# Patient Record
Sex: Male | Born: 1975 | Race: White | Hispanic: No | Marital: Single | Smoking: Current every day smoker
Health system: Southern US, Community
[De-identification: ages and names within clinical notes are randomized; demographics above are authoritative.]

## PROBLEM LIST (undated history)

## (undated) DIAGNOSIS — G8220 Paraplegia, unspecified: Secondary | ICD-10-CM

## (undated) DIAGNOSIS — E119 Type 2 diabetes mellitus without complications: Secondary | ICD-10-CM

---

## 2015-05-05 ENCOUNTER — Emergency Department (HOSPITAL_COMMUNITY)
Admission: EM | Admit: 2015-05-05 | Discharge: 2015-05-05 | Disposition: A | Discharge status: Home / Self Care | Payer: Self-pay | Attending: Emergency Medicine | Admitting: Emergency Medicine

## 2015-05-05 ENCOUNTER — Emergency Department (HOSPITAL_COMMUNITY): Payer: Self-pay

## 2015-05-05 ENCOUNTER — Encounter (HOSPITAL_COMMUNITY): Payer: Self-pay | Admitting: *Deleted

## 2015-05-05 DIAGNOSIS — W25XXXD Contact with sharp glass, subsequent encounter: Secondary | ICD-10-CM | POA: Insufficient documentation

## 2015-05-05 DIAGNOSIS — E119 Type 2 diabetes mellitus without complications: Secondary | ICD-10-CM | POA: Insufficient documentation

## 2015-05-05 DIAGNOSIS — G8220 Paraplegia, unspecified: Secondary | ICD-10-CM | POA: Insufficient documentation

## 2015-05-05 DIAGNOSIS — Z880 Allergy status to penicillin: Secondary | ICD-10-CM | POA: Insufficient documentation

## 2015-05-05 DIAGNOSIS — L03116 Cellulitis of left lower limb: Secondary | ICD-10-CM | POA: Insufficient documentation

## 2015-05-05 DIAGNOSIS — Z720 Tobacco use: Secondary | ICD-10-CM | POA: Insufficient documentation

## 2015-05-05 DIAGNOSIS — Z993 Dependence on wheelchair: Secondary | ICD-10-CM | POA: Insufficient documentation

## 2015-05-05 HISTORY — DX: Type 2 diabetes mellitus without complications: E11.9

## 2015-05-05 HISTORY — DX: Paraplegia, unspecified: G82.20

## 2015-05-05 LAB — CBG MONITORING, ED: Glucose-Capillary: 173 mg/dL — ABNORMAL HIGH (ref 65–99)

## 2015-05-05 MED ORDER — HYDROCODONE-ACETAMINOPHEN 5-325 MG PO TABS: 1.0000 | ORAL_TABLET | Freq: Four times a day (QID) | ORAL | Status: AC | PRN

## 2015-05-05 MED ORDER — CLINDAMYCIN PHOSPHATE 600 MG/50ML IV SOLN: 600.0000 mg | Freq: Once | INTRAVENOUS | Status: DC

## 2015-05-05 MED ORDER — CLINDAMYCIN HCL 300 MG PO CAPS: 300.0000 mg | ORAL_CAPSULE | Freq: Four times a day (QID) | ORAL | Status: AC

## 2015-05-05 NOTE — ED Notes (Signed)
Pt refused iv/ lab draw dr Judd Lien informed

## 2015-05-05 NOTE — ED Notes (Signed)
Pt in with wound to the bottom of his left foot, states he hit it on his power chair a few days ago, redness and swelling noted, denies fever at home

## 2015-05-05 NOTE — ED Provider Notes (Signed)
CSN: [id number]     Arrival date & time 05/05/15  1350 History   First MD Initiated Contact with Patient 05/05/15 1455     Chief Complaint  Patient presents with  . 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 He presents today for evaluation of a left foot injury. He states that he was riding in his power wheelchair when he struck his foot on a sliding glass door. Initially had a small laceration, however does now appearing more swollen and draining. He denies any fevers or chills.  Patient is a 39 y.o. male presenting with foot injury. The history is provided by the patient.  Foot Injury Location:  Foot Time since incident:  3 days Injury: yes   Foot location:  L foot Pain details:    Radiates to:  Does not radiate   Severity:  Moderate   Onset quality:  Sudden   Timing:  Constant   Progression:  Worsening   Past Medical History  Diagnosis Date  . Diabetes mellitus without complication   . Paralysis of both lower limbs    History reviewed. No pertinent past surgical history. History reviewed. No pertinent family history. History  Substance Use Topics  . Smoking status: Current Every Day Smoker  . Smokeless tobacco: Not on file  . Alcohol Use: Not on file    Review of Systems  All other systems reviewed and are negative.     Allergies  Penicillins  Home Medications   Prior to Admission medications   Not on File   BP 150/63 mmHg  Pulse 87  Temp(Src) 97.7 F (36.5 C) (Oral)  Resp 19  Ht 5\' 5"  (1.651 m)  Wt 330 lb (149.687 kg)  BMI 54.91 kg/m2  SpO2 98% Physical Exam  Constitutional: He is oriented to person, place, and time. He appears well-developed and well-nourished. No distress.  HENT:  Head: Normocephalic and atraumatic.  Neck: Normal range of motion. Neck  supple.  Musculoskeletal: Normal range of motion.  The bottom of the left foot is noted to have a laceration between the third and fourth toes that is draining a yellow, serous fluid. There is slight erythema surrounding it, however no streaks that extend up into the foot.  Neurological: He is alert and oriented to person, place, and time.  Skin: Skin is warm and dry. He is not diaphoretic.  Nursing note and vitals reviewed.   ED Course  Procedures (including critical care time) Labs Review Labs Reviewed  CBG MONITORING, ED - Abnormal; Notable for the following:    Glucose-Capillary 173 (*)    All other components within normal limits  CBC  BASIC METABOLIC PANEL    Imaging Review Dg Foot Complete Left  05/05/2015   CLINICAL DATA:  Wound at the bottom of the foot at the base the toes, struck foot on a power chair few days ago, redness and swelling noted, history diabetes mellitus, smoking  EXAM: LEFT FOOT - COMPLETE 3+ VIEW  COMPARISON:  None  FINDINGS: Diffuse soft tissue swelling.  Diffuse osseous demineralization.  Joint spaces preserved.  No acute fracture, dislocation, or bone destruction.  Accessory ossicle at medial margin of tarsal navicular, os tibiale externum.  No definite soft tissue gas or radiopaque foreign body identified.  IMPRESSION: Osseous demineralization soft tissue swelling.  No acute osseous findings.   Electronically Signed   By:  Ulyses Southward M.D.   On: 05/05/2015 14:39     EKG Interpretation None      MDM   Final diagnoses:  None    Patient presents after striking his foot on a sliding glass door.  This occurred several days ago and the wound is now appearing to be infected. He has a history of diabetes and lower extremity weakness secondary to a spinal abscess. My plan was to administer IV and oral antibiotics, however the patient is declining an IV and refusing this. He will be discharged with clindamycin and when necessary return.    Geoffery Lyons,  MD 05/05/15 [phone]

## 2015-05-05 NOTE — ED Notes (Signed)
Pt aware to return for worsening of pain drainage or fever. Pt alert x4 respirations easy non labored.

## 2015-05-05 NOTE — Discharge Instructions (Signed)
Clindamycin as prescribed.  Hydrocodone as prescribed as needed for pain.  Return to the emergency department of symptoms significantly worsen or change.   Cellulitis Cellulitis is an infection of the skin and the tissue beneath it. The infected area is usually red and tender. Cellulitis occurs most often in the arms and lower legs.  CAUSES  Cellulitis is caused by bacteria that enter the skin through cracks or cuts in the skin. The most common types of bacteria that cause cellulitis are staphylococci and streptococci. SIGNS AND SYMPTOMS   Redness and warmth.  Swelling.  Tenderness or pain.  Fever. DIAGNOSIS  Your health care provider can usually determine what is wrong based on a physical exam. Blood tests may also be done. TREATMENT  Treatment usually involves taking an antibiotic medicine. HOME CARE INSTRUCTIONS   Take your antibiotic medicine as directed by your health care provider. Finish the antibiotic even if you start to feel better.  Keep the infected arm or leg elevated to reduce swelling.  Apply a warm cloth to the affected area up to 4 times per day to relieve pain.  Take medicines only as directed by your health care provider.  Keep all follow-up visits as directed by your health care provider. SEEK MEDICAL CARE IF:   You notice red streaks coming from the infected area.  Your red area gets larger or turns dark in color.  Your bone or joint underneath the infected area becomes painful after the skin has healed.  Your infection returns in the same area or another area.  You notice a swollen bump in the infected area.  You develop new symptoms.  You have a fever. SEEK IMMEDIATE MEDICAL CARE IF:   You feel very sleepy.  You develop vomiting or diarrhea.  You have a general ill feeling (malaise) with muscle aches and pains. MAKE SURE YOU:   Understand these instructions.  Will watch your condition.  Will get help right away if you are not doing  well or get worse. Document Released: 06/30/2005 Document Revised: 02/04/2014 Document Reviewed: 12/06/2011 [facility] Patient Information 2015 [place], [place]. This information is not intended to replace advice given to you by your health care provider. Make sure you discuss any questions you have with your health care provider.

## 2015-05-05 NOTE — ED Notes (Addendum)
Pt is refusing to get in the bed and in gown for monitoring. MD notified

## 2016-08-25 IMAGING — DX DG FOOT COMPLETE 3+V*L*
3 series · 3 of 3 positions shown · non-contrast
Comparison: None

CLINICAL DATA: Wound at the bottom of the foot at the base the
toes, struck foot on a power chair few days ago, redness and
swelling noted, history diabetes mellitus, smoking

EXAM:
LEFT FOOT - COMPLETE 3+ VIEW

[x foot ap left]
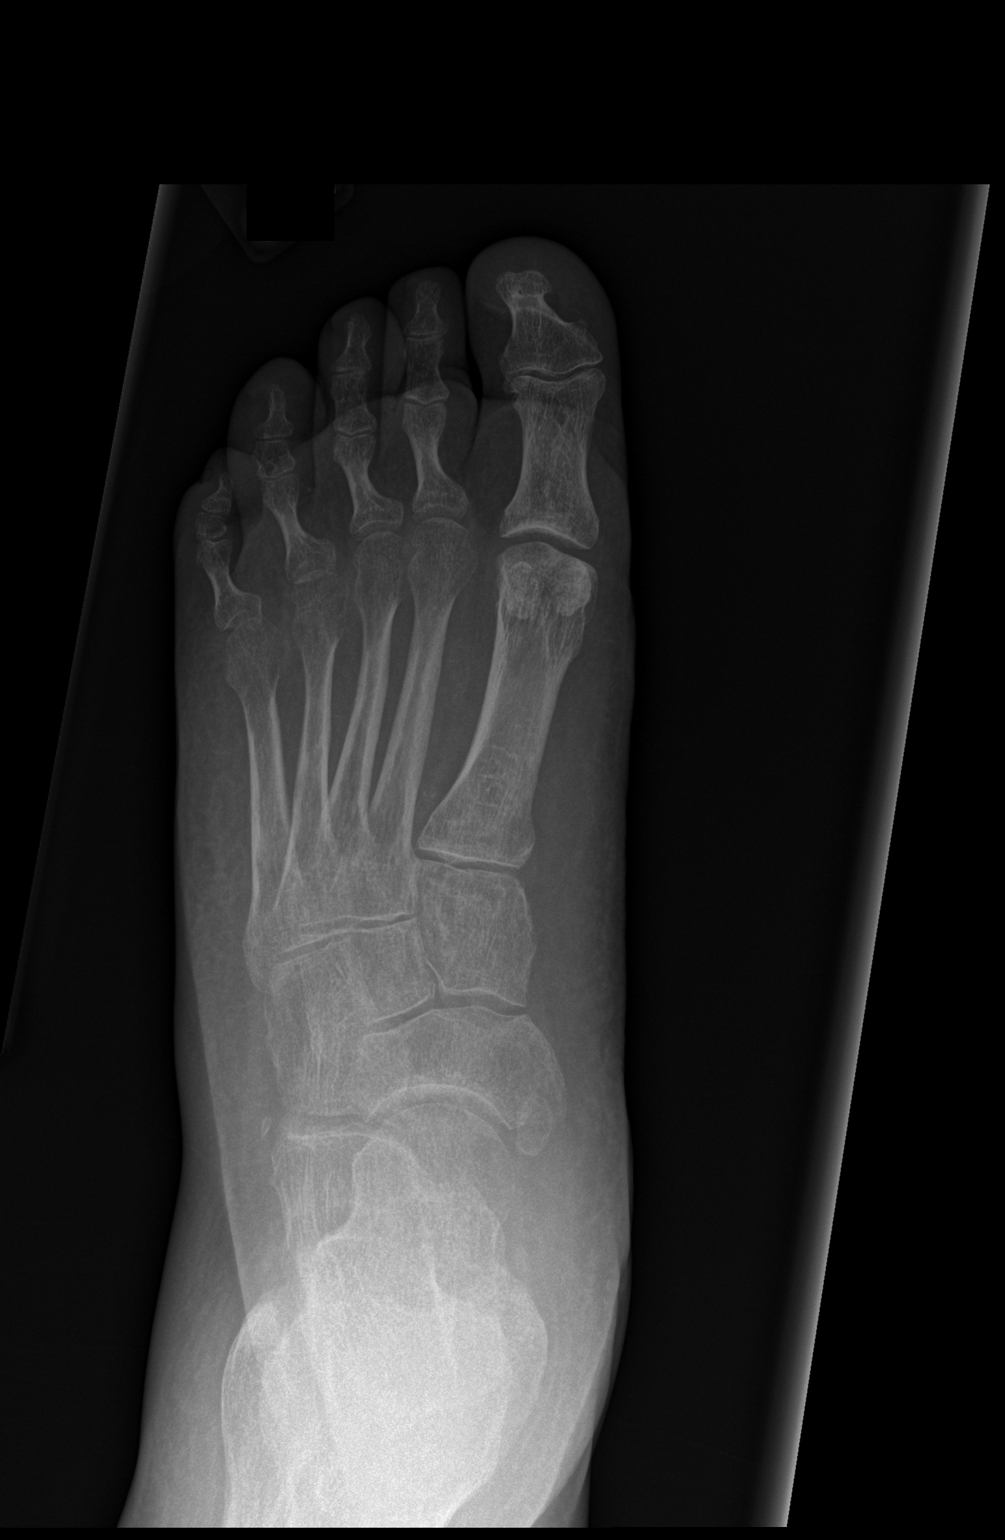

[x foot obl left]
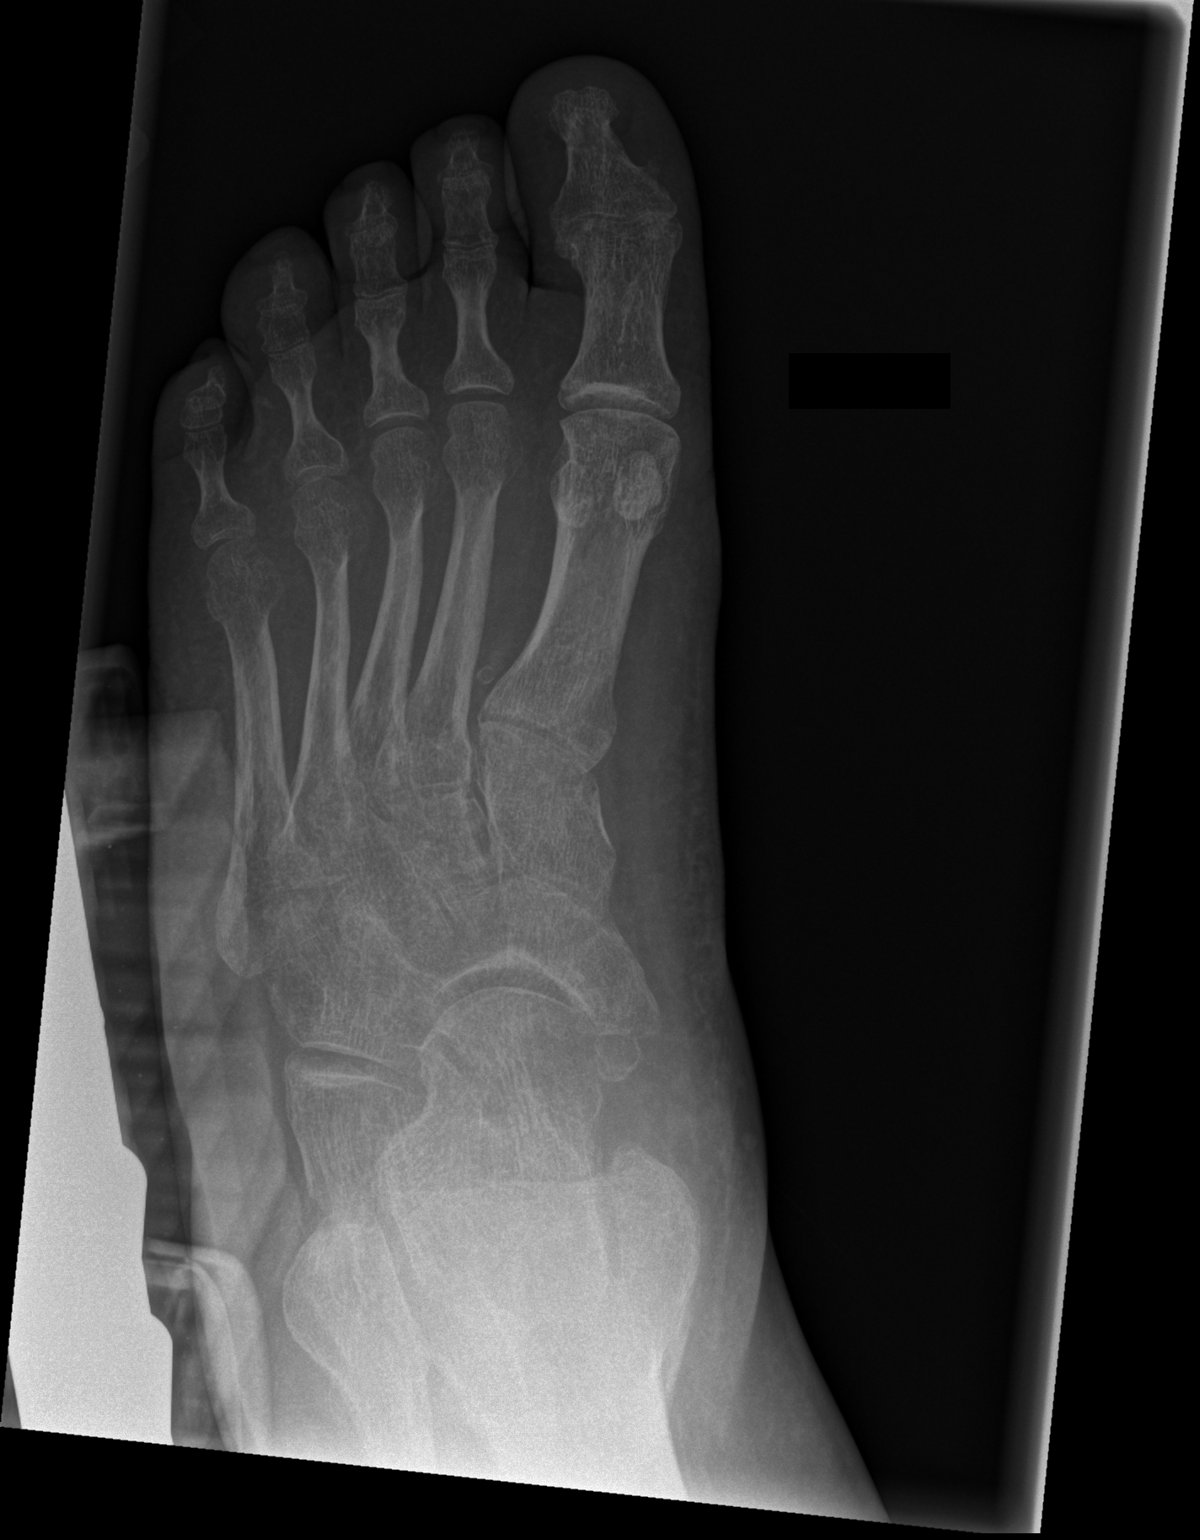

[w foot lat left]
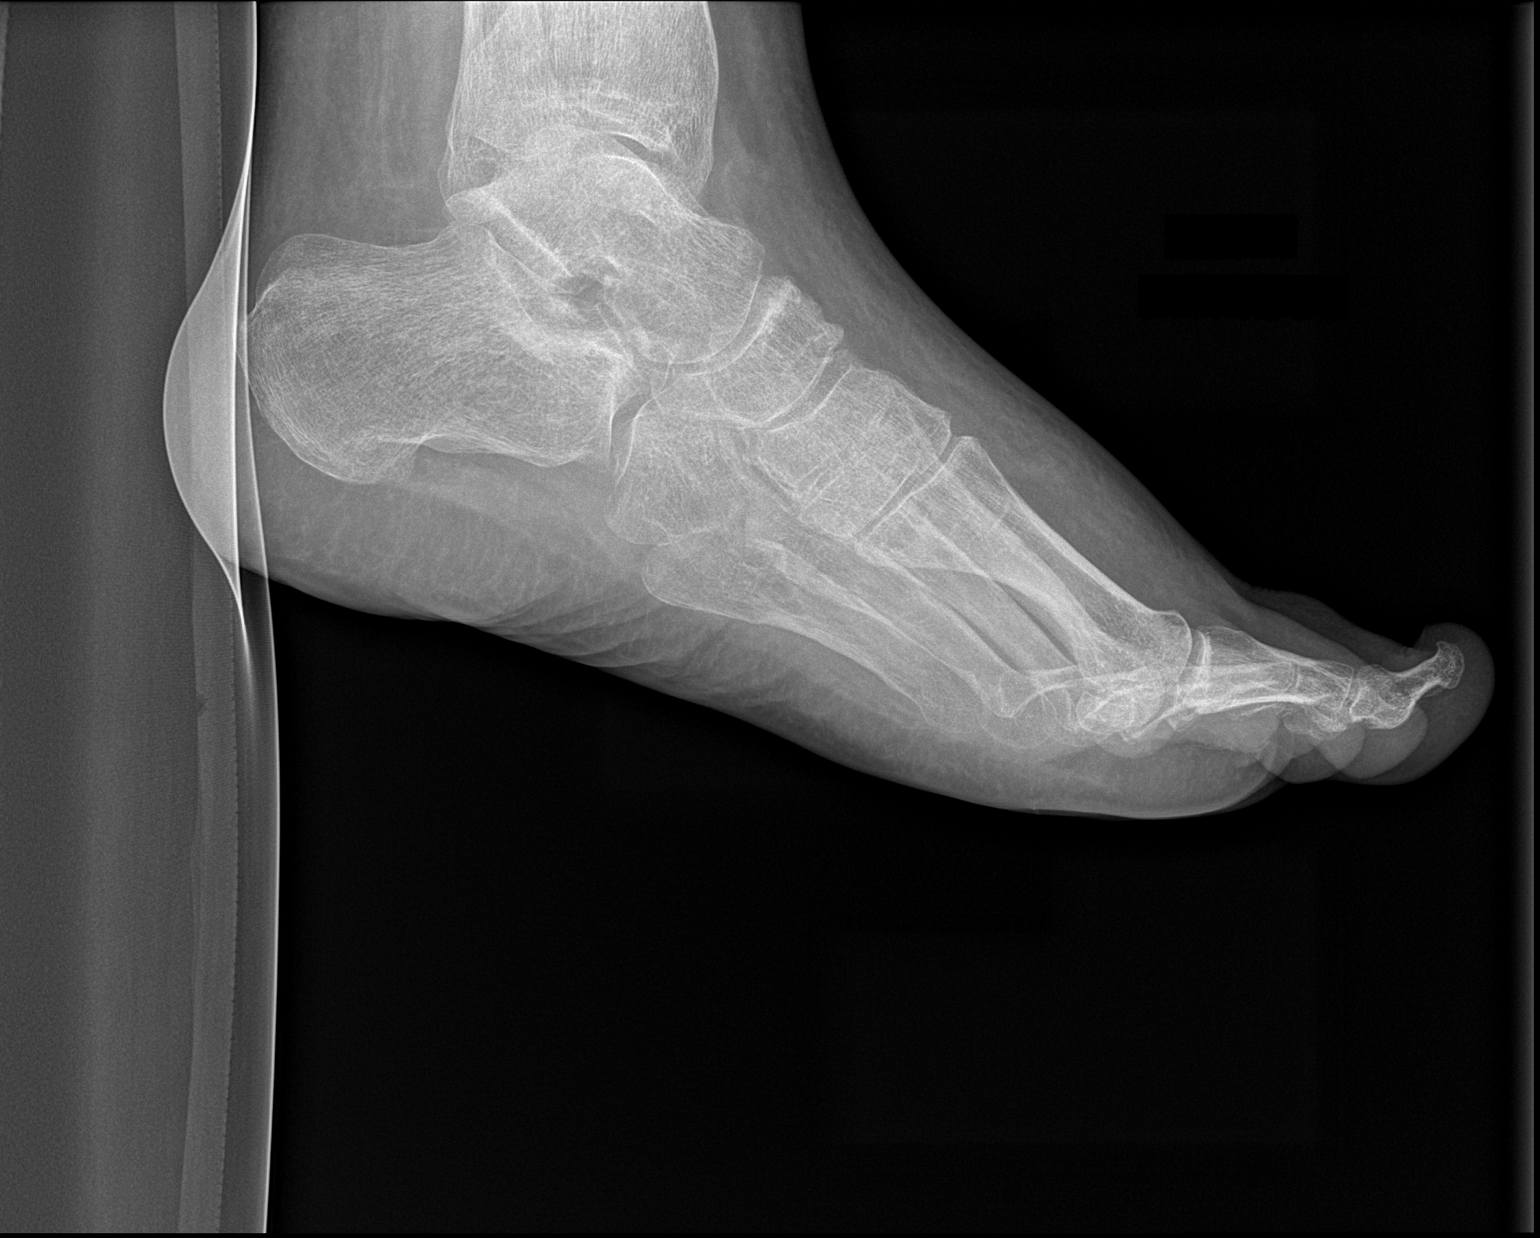

[3 of 3 positions shown; findings below may reference images not displayed]

FINDINGS: Diffuse soft tissue swelling.

Diffuse osseous demineralization.

Joint spaces preserved.

No acute fracture, dislocation, or bone destruction.

Accessory ossicle at medial margin of tarsal navicular, os tibiale
externum.

No definite soft tissue gas or radiopaque foreign body identified.
IMPRESSION: Osseous demineralization soft tissue swelling.

No acute osseous findings.
